# Patient Record
Sex: Male | Born: 1975 | Race: White | Hispanic: No | Marital: Married | Smoking: Never smoker
Health system: Southern US, Community
[De-identification: ages and names within clinical notes are randomized; demographics above are authoritative.]

## PROBLEM LIST (undated history)

## (undated) DIAGNOSIS — F419 Anxiety disorder, unspecified: Secondary | ICD-10-CM

## (undated) HISTORY — PX: NO PAST SURGERIES: SHX2092

---

## 2003-07-19 ENCOUNTER — Emergency Department (HOSPITAL_COMMUNITY): Admission: EM | Admit: 2003-07-19 | Discharge: 2003-07-19 | Payer: Self-pay | Admitting: Emergency Medicine

## 2013-02-17 ENCOUNTER — Emergency Department (HOSPITAL_COMMUNITY)
Admission: EM | Admit: 2013-02-17 | Discharge: 2013-02-17 | Disposition: A | Discharge status: Home / Self Care | Payer: Managed Care, Other (non HMO) | Attending: Emergency Medicine | Admitting: Emergency Medicine

## 2013-02-17 ENCOUNTER — Encounter (HOSPITAL_COMMUNITY): Payer: Self-pay | Admitting: *Deleted

## 2013-02-17 DIAGNOSIS — T148XXA Other injury of unspecified body region, initial encounter: Secondary | ICD-10-CM

## 2013-02-17 DIAGNOSIS — Z23 Encounter for immunization: Secondary | ICD-10-CM | POA: Insufficient documentation

## 2013-02-17 MED ORDER — RABIES VACCINE, PCEC IM SUSR
1.0000 mL | INTRAMUSCULAR | Status: AC
  Administered 2013-02-17: 1 mL via INTRAMUSCULAR
  Filled 2013-02-17: qty 1

## 2013-02-17 MED ORDER — RABIES IMMUNE GLOBULIN 150 UNIT/ML IM INJ
20.0000 [IU]/kg | INJECTION | INTRAMUSCULAR | Status: AC
  Administered 2013-02-17: 2100 [IU] via INTRAMUSCULAR
  Filled 2013-02-17: qty 14

## 2013-02-17 NOTE — ED Provider Notes (Signed)
CSN: [id number]     Arrival date & time 02/17/13  1815 History  This chart was scribed for non-physician practitioner Charlestine Night, PA-C, working with Flint Melter, MD by Dorothey Baseman, ED Scribe. This patient was seen in room TR10C/TR10C and the patient's care was started at [facility] PM.    Chief Complaint  Patient presents with  . Animal Bite   The history is provided by the patient. No language interpreter was used.   HPI Comments: Shane Mora is a 37 y.o. male who presents to the Emergency Department requesting a rabies vaccination. Patient was bitten by a dog on 02/07/2013 and was given a tetanus vaccination on 02/10/2013, but did not receive the rabies vaccination at that time. He states that he has notified animal control and saw that the dog was wearing a collar, but has not been able to locate the dog or its owner. Patient expresses that his concern for contracting rabies is low, but just wants to be sure. He denies fever, vomiting, diarrhea, or any other symptoms at this time.   History reviewed. No pertinent past medical history. History reviewed. No pertinent past surgical history. No family history on file. History  Substance Use Topics  . Smoking status: Never Smoker   . Smokeless tobacco: Not on file  . Alcohol Use: Yes     Comment: soc    Review of Systems  A complete 10 system review of systems was obtained and all systems are negative except as noted in the HPI and PMH.   Allergies  Flu virus vaccine  Home Medications  No current outpatient prescriptions on file.  Triage Vitals: BP 132/77  Pulse 58  Temp(Src) 97.9 F (36.6 C) (Oral)  Resp 16  Ht 5\' 11"  (1.803 m)  Wt 230 lb (104.327 kg)  BMI 32.09 kg/m2  SpO2 98%  Physical Exam  Nursing note and vitals reviewed. Constitutional: He is oriented to person, place, and time. He appears well-developed and well-nourished. No distress.  HENT:  Head: Normocephalic and atraumatic.  Eyes: Conjunctivae are  normal.  Neck: Normal range of motion. Neck supple.  Musculoskeletal: Normal range of motion.  Neurological: He is alert and oriented to person, place, and time.  Skin: Skin is warm and dry.  Psychiatric: He has a normal mood and affect. His behavior is normal.    ED Course  Procedures (including critical care time)  Medications  rabies immune globulin (HYPERAB) injection 2,100 Units (2,100 Units Intramuscular Given 02/17/13 2029)  rabies vaccine (RABAVERT) injection 1 mL (1 mL Intramuscular Given 02/17/13 2030)   DIAGNOSTIC STUDIES: Oxygen Saturation is 98% on room air, normal by my interpretation.    COORDINATION OF CARE: 6:44PM- Advised patient that rabies vaccinations are usually administered within a smaller time frame following an animal bite. Consulted with pharmacy before ordering the vaccination. Discussed treatment plan with patient at bedside and patient verbalized agreement.      MDM    Carlyle Dolly, PA-C 02/17/13 [phone]

## 2013-02-17 NOTE — ED Notes (Signed)
Pt monitored for 45 minutes per pharmacy recommendation. Denies any recent changes since vaccine administration.

## 2013-02-17 NOTE — ED Notes (Signed)
Pt was bite on Sept 5th by a dog he was helping and patient has notified animal control.  [place].  Last tetanus on Monday 02/10/13.  Pt was referred here to have rabies vaccination.

## 2013-02-17 NOTE — ED Notes (Addendum)
Pt referred to ED by urgent care to receive rabies vaccine. Pt sts he was bitten by a dog on his R wrist Sept 5. Pt denies N/V, dizziness, fever, or pain at site. NAD noted. A&Ox4.

## 2013-02-18 NOTE — ED Provider Notes (Signed)
Medical screening examination/treatment/procedure(s) were performed by non-physician practitioner and as supervising physician I was immediately available for consultation/collaboration.  Flint Melter, MD 02/18/13 [phone]

## 2013-02-21 ENCOUNTER — Emergency Department (HOSPITAL_COMMUNITY)
Admission: EM | Admit: 2013-02-21 | Discharge: 2013-02-21 | Disposition: A | Discharge status: Home / Self Care | Payer: Managed Care, Other (non HMO) | Source: Home / Self Care

## 2013-02-21 ENCOUNTER — Encounter (HOSPITAL_COMMUNITY): Payer: Self-pay | Admitting: Emergency Medicine

## 2013-02-21 DIAGNOSIS — Z203 Contact with and (suspected) exposure to rabies: Secondary | ICD-10-CM

## 2013-02-21 MED ORDER — RABIES VACCINE, PCEC IM SUSR
INTRAMUSCULAR | Status: AC
  Filled 2013-02-21: qty 1

## 2013-02-21 MED ORDER — RABIES VACCINE, PCEC IM SUSR
1.0000 mL | Freq: Once | INTRAMUSCULAR | Status: AC
  Administered 2013-02-21: 1 mL via INTRAMUSCULAR

## 2013-02-21 NOTE — ED Notes (Signed)
Here for first rabies injection. Voices no concerns at this time.

## 2013-02-24 ENCOUNTER — Emergency Department (HOSPITAL_COMMUNITY)
Admission: EM | Admit: 2013-02-24 | Discharge: 2013-02-24 | Disposition: A | Discharge status: Home / Self Care | Payer: Managed Care, Other (non HMO) | Source: Home / Self Care

## 2013-02-24 ENCOUNTER — Encounter (HOSPITAL_COMMUNITY): Payer: Self-pay

## 2013-02-24 DIAGNOSIS — Z203 Contact with and (suspected) exposure to rabies: Secondary | ICD-10-CM

## 2013-02-24 MED ORDER — RABIES VACCINE, PCEC IM SUSR
1.0000 mL | Freq: Once | INTRAMUSCULAR | Status: AC
  Administered 2013-02-24: 1 mL via INTRAMUSCULAR

## 2013-02-24 MED ORDER — RABIES VACCINE, PCEC IM SUSR
INTRAMUSCULAR | Status: AC
  Filled 2013-02-24: qty 1

## 2013-02-24 NOTE — ED Notes (Signed)
Here for day #7 of rabies series ; NAD at present

## 2013-03-03 ENCOUNTER — Encounter (HOSPITAL_COMMUNITY): Payer: Self-pay | Admitting: Emergency Medicine

## 2013-03-03 ENCOUNTER — Emergency Department (INDEPENDENT_AMBULATORY_CARE_PROVIDER_SITE_OTHER)
Admission: EM | Admit: 2013-03-03 | Discharge: 2013-03-03 | Disposition: A | Discharge status: Home / Self Care | Payer: Managed Care, Other (non HMO) | Source: Home / Self Care

## 2013-03-03 DIAGNOSIS — Z203 Contact with and (suspected) exposure to rabies: Secondary | ICD-10-CM

## 2013-03-03 MED ORDER — RABIES VACCINE, PCEC IM SUSR
1.0000 mL | Freq: Once | INTRAMUSCULAR | Status: AC
  Administered 2013-03-03: 1 mL via INTRAMUSCULAR

## 2013-03-03 MED ORDER — RABIES VACCINE, PCEC IM SUSR
INTRAMUSCULAR | Status: AC
  Filled 2013-03-03: qty 1

## 2013-03-03 NOTE — Discharge Instructions (Signed)
Follow up as needed

## 2013-03-03 NOTE — ED Notes (Signed)
Rabies injection 

## 2013-03-03 NOTE — ED Notes (Signed)
Patient feeling comfortable to leave

## 2013-03-03 NOTE — ED Notes (Signed)
Provided sprite

## 2013-03-03 NOTE — ED Notes (Signed)
Patient mentioned anxiety issues, requested to sit a bit prior to leaving

## 2014-07-10 ENCOUNTER — Other Ambulatory Visit: Payer: Self-pay | Admitting: Dermatology

## 2014-07-10 DIAGNOSIS — D485 Neoplasm of uncertain behavior of skin: Secondary | ICD-10-CM

## 2014-07-21 ENCOUNTER — Encounter (INDEPENDENT_AMBULATORY_CARE_PROVIDER_SITE_OTHER): Payer: Self-pay

## 2014-07-21 ENCOUNTER — Ambulatory Visit
Admission: RE | Admit: 2014-07-21 | Discharge: 2014-07-21 | Disposition: A | Discharge status: Home / Self Care | Payer: Managed Care, Other (non HMO) | Source: Ambulatory Visit | Attending: Dermatology | Admitting: Dermatology

## 2014-07-21 DIAGNOSIS — D485 Neoplasm of uncertain behavior of skin: Secondary | ICD-10-CM

## 2014-08-03 ENCOUNTER — Ambulatory Visit (INDEPENDENT_AMBULATORY_CARE_PROVIDER_SITE_OTHER): Payer: Managed Care, Other (non HMO) | Admitting: Psychology

## 2014-08-03 DIAGNOSIS — F411 Generalized anxiety disorder: Secondary | ICD-10-CM

## 2014-08-11 ENCOUNTER — Ambulatory Visit (INDEPENDENT_AMBULATORY_CARE_PROVIDER_SITE_OTHER): Payer: Managed Care, Other (non HMO) | Admitting: Psychology

## 2014-08-11 DIAGNOSIS — F411 Generalized anxiety disorder: Secondary | ICD-10-CM | POA: Diagnosis not present

## 2014-08-18 ENCOUNTER — Ambulatory Visit (INDEPENDENT_AMBULATORY_CARE_PROVIDER_SITE_OTHER): Payer: Managed Care, Other (non HMO) | Admitting: Psychology

## 2014-08-18 DIAGNOSIS — F411 Generalized anxiety disorder: Secondary | ICD-10-CM | POA: Diagnosis not present

## 2014-09-01 ENCOUNTER — Ambulatory Visit (INDEPENDENT_AMBULATORY_CARE_PROVIDER_SITE_OTHER): Payer: Managed Care, Other (non HMO) | Admitting: Psychology

## 2014-09-01 DIAGNOSIS — F411 Generalized anxiety disorder: Secondary | ICD-10-CM

## 2014-09-02 ENCOUNTER — Other Ambulatory Visit: Payer: Self-pay | Admitting: Family Medicine

## 2014-09-02 DIAGNOSIS — R0600 Dyspnea, unspecified: Secondary | ICD-10-CM

## 2014-09-03 ENCOUNTER — Ambulatory Visit (INDEPENDENT_AMBULATORY_CARE_PROVIDER_SITE_OTHER): Payer: Managed Care, Other (non HMO) | Admitting: Internal Medicine

## 2014-09-03 ENCOUNTER — Encounter (INDEPENDENT_AMBULATORY_CARE_PROVIDER_SITE_OTHER): Payer: Self-pay

## 2014-09-03 DIAGNOSIS — R0600 Dyspnea, unspecified: Secondary | ICD-10-CM | POA: Diagnosis not present

## 2014-09-03 LAB — PULMONARY FUNCTION TEST
DL/VA % PRED: 94 %
DL/VA: 4.52 ml/min/mmHg/L
DLCO unc % pred: 82 %
DLCO unc: 28.99 ml/min/mmHg
FEF 25-75 POST: 3.42 L/s
FEF 25-75 Pre: 2.87 L/sec
FEF2575-%CHANGE-POST: 19 %
FEF2575-%PRED-POST: 82 %
FEF2575-%PRED-PRE: 69 %
FEV1-%CHANGE-POST: 5 %
FEV1-%PRED-PRE: 77 %
FEV1-%Pred-Post: 81 %
FEV1-POST: 3.64 L
FEV1-Pre: 3.43 L
FEV1FVC-%CHANGE-POST: 0 %
FEV1FVC-%Pred-Pre: 96 %
FEV6-%Change-Post: 4 %
FEV6-%Pred-Post: 84 %
FEV6-%Pred-Pre: 80 %
FEV6-PRE: 4.43 L
FEV6-Post: 4.65 L
FEV6FVC-%Change-Post: 0 %
FEV6FVC-%PRED-PRE: 101 %
FEV6FVC-%Pred-Post: 101 %
FVC-%Change-Post: 5 %
FVC-%Pred-Post: 83 %
FVC-%Pred-Pre: 78 %
FVC-PRE: 4.44 L
FVC-Post: 4.67 L
POST FEV1/FVC RATIO: 78 %
Post FEV6/FVC ratio: 99 %
Pre FEV1/FVC ratio: 77 %
Pre FEV6/FVC Ratio: 100 %
RV % pred: 85 %
RV: 1.67 L
TLC % pred: 82 %
TLC: 6.03 L

## 2014-09-03 NOTE — Progress Notes (Signed)
PFT done today. 

## 2014-09-28 ENCOUNTER — Ambulatory Visit (INDEPENDENT_AMBULATORY_CARE_PROVIDER_SITE_OTHER): Payer: Managed Care, Other (non HMO) | Admitting: Internal Medicine

## 2014-09-28 ENCOUNTER — Encounter: Payer: Self-pay | Admitting: Internal Medicine

## 2014-09-28 ENCOUNTER — Ambulatory Visit (INDEPENDENT_AMBULATORY_CARE_PROVIDER_SITE_OTHER)
Admission: RE | Admit: 2014-09-28 | Discharge: 2014-09-28 | Disposition: A | Discharge status: Home / Self Care | Payer: Managed Care, Other (non HMO) | Source: Ambulatory Visit | Attending: Internal Medicine | Admitting: Internal Medicine

## 2014-09-28 VITALS — BP 112/72 | HR 67 | Ht 71.0 in | Wt 225.4 lb

## 2014-09-28 DIAGNOSIS — R0600 Dyspnea, unspecified: Secondary | ICD-10-CM

## 2014-09-28 MED ORDER — FAMOTIDINE 20 MG PO TABS: ORAL_TABLET | ORAL | Status: AC

## 2014-09-28 MED ORDER — PANTOPRAZOLE SODIUM 40 MG PO TBEC: 40.0000 mg | DELAYED_RELEASE_TABLET | Freq: Every day | ORAL | Status: DC

## 2014-09-28 MED ORDER — FAMOTIDINE 20 MG PO TABS: ORAL_TABLET | ORAL | Status: DC

## 2014-09-28 NOTE — Patient Instructions (Signed)
Pantoprazole (protonix) 40 mg   Take 30-60 min before first meal of the day and Pepcid 20 mg one bedtime until return to office - this is the best way to tell whether stomach acid is contributing to your problem.   GERD (REFLUX)  is an extremely common cause of respiratory symptoms just like yours , many times with no obvious heartburn at all.    It can be treated with medication, but also with lifestyle changes including avoidance of late meals, excessive alcohol, smoking cessation, and avoid fatty foods, chocolate, peppermint, colas, red wine, and acidic juices such as orange juice.  NO MINT OR MENTHOL PRODUCTS SO NO COUGH DROPS  USE SUGARLESS CANDY INSTEAD (Jolley ranchers or Stover's or Life Savers) or even ice chips will also do - the key is to swallow to prevent all throat clearing. NO OIL BASED VITAMINS - use powdered substitutes.   Call us  to schedule asthma challenge test in 2weeks - no sooner  Please remember to go to the  x-ray department downstairs for your tests - we will call you with the results when they are available.

## 2014-09-28 NOTE — Progress Notes (Signed)
Subjective:    Patient ID: Shane Mora, male    DOB: 1976/03/05    MRN: [id number]  HPI  66 yowm never smoker occ rhinitis with spring sneezing abuptly  in January 2016 began having attacks where could not catch his breath ? Work related typically lasting a few breaths to 30 min c/w panic attacks per psychology but referred to pulmonary  09/28/2014 to r/o asthma.   09/28/2014 1st [facility] office visit/ Shane Mora   Chief Complaint  Patient presents with  . Pulmonary Consult    Pt referred by Dr. Deniece Mora. Pt states he did a PFT x3 wks. Pt c/o SOB with excertion, occasional wheezing. prod cough thick yellow mucus x1 mth.   for the last 3 years not doing any aerobics but doing steps fine at work then and now. After 30 min of playing with kids p 25 lb gain more sob than usual Episodes occurrying every other day never noct  No obvious  Patterns in day to day or daytime variabilty or assoc  cp or chest tightness, subjective wheeze overt sinus or hb symptoms. No unusual exp hx or h/o childhood pna/ asthma or knowledge of premature birth.  Sleeping ok without nocturnal  or early am exacerbation  of respiratory  c/o's or need for noct saba. Also denies any obvious fluctuation of symptoms with weather or environmental changes or other aggravating or alleviating factors except as outlined above   Current Medications, Allergies, Complete Past Medical History, Past Surgical History, Family History, and Social History were reviewed in [employer] record.               Review of Systems  Constitutional: Negative for fever and unexpected weight change.  HENT: Positive for congestion. Negative for dental problem, ear discharge, mouth sores, nosebleeds, postnasal drip, rhinorrhea, sinus pressure, sneezing, sore throat and trouble swallowing.   Eyes: Negative for redness and itching.  Respiratory: Positive for cough, chest tightness, shortness of breath and wheezing.     Cardiovascular: Negative for palpitations and leg swelling.  Genitourinary: Negative for dysuria.  Neurological: Negative for headaches.  Hematological: Does not bruise/bleed easily.  Psychiatric/Behavioral: Negative for dysphoric mood.       Objective:   Physical Exam  amb wm nad   Wt Readings from Last 3 Encounters:  09/28/14 225 lb 6.4 oz (102.241 kg)  02/17/13 230 lb (104.327 kg)    Vital signs reviewed   HEENT: nl dentition, turbinates, and orophanx. Nl external ear canals without cough reflex   NECK :  without JVD/Nodes/TM/ nl carotid upstrokes bilaterally   LUNGS: no acc muscle use, clear to A and P bilaterally without cough on insp or exp maneuvers   CV:  RRR  no s3 or murmur or increase in P2, no edema   ABD:  soft and nontender with nl excursion in the supine position. No bruits or organomegaly, bowel sounds nl  MS:  warm without deformities, calf tenderness, cyanosis or clubbing  SKIN: warm and dry without lesions    NEURO:  alert, approp, no deficits    CXR PA and Lateral:   09/28/2014 :     I personally reviewed images and agree with radiology impression as follows:     1. Cardiomegaly. No overt congestive heart failure. 2. Low lung volumes with mild bibasilar atelectasis. My impression: poor insp lung volumes / effort related       Labs ordered/ reviewed   Lab 09/29/14 1658  NA 140  K 3.8  CL 106  CO2 29  BUN 17  CREATININE 0.88  GLUCOSE 102*    Recent Labs Lab 09/29/14 1658  HGB 14.3  HCT 43.3  WBC 6.2  PLT 212.0     Lab Results  Component Value Date   TSH 0.70 09/29/2014     Lab Results  Component Value Date   PROBNP 11.0 09/29/2014            Assessment & Plan:

## 2014-09-29 ENCOUNTER — Encounter: Payer: Self-pay | Admitting: Internal Medicine

## 2014-09-29 ENCOUNTER — Other Ambulatory Visit (INDEPENDENT_AMBULATORY_CARE_PROVIDER_SITE_OTHER): Payer: Managed Care, Other (non HMO)

## 2014-09-29 DIAGNOSIS — R0600 Dyspnea, unspecified: Secondary | ICD-10-CM | POA: Diagnosis not present

## 2014-09-29 LAB — BRAIN NATRIURETIC PEPTIDE: PRO B NATRI PEPTIDE: 11 pg/mL (ref 0.0–100.0)

## 2014-09-29 LAB — TSH: TSH: 0.7 u[IU]/mL (ref 0.35–4.50)

## 2014-09-29 LAB — BASIC METABOLIC PANEL
BUN: 17 mg/dL (ref 6–23)
CHLORIDE: 106 meq/L (ref 96–112)
CO2: 29 mEq/L (ref 19–32)
Calcium: 9.4 mg/dL (ref 8.4–10.5)
Creatinine, Ser: 0.88 mg/dL (ref 0.40–1.50)
GFR: 101.57 mL/min (ref >60.00)
Glucose, Bld: 102 mg/dL — ABNORMAL HIGH (ref 70–99)
Potassium: 3.8 mEq/L (ref 3.5–5.1)
SODIUM: 140 meq/L (ref 135–145)

## 2014-09-29 LAB — CBC WITH DIFFERENTIAL/PLATELET
BASOS ABS: 0 10*3/uL (ref 0.0–0.1)
Basophils Relative: 0.4 % (ref 0.0–3.0)
EOS PCT: 4.8 % (ref 0.0–5.0)
Eosinophils Absolute: 0.3 10*3/uL (ref 0.0–0.7)
HCT: 43.3 % (ref 39.0–52.0)
HEMOGLOBIN: 14.3 g/dL (ref 13.0–17.0)
LYMPHS PCT: 36.2 % (ref 12.0–46.0)
Lymphs Abs: 2.2 10*3/uL (ref 0.7–4.0)
MCHC: 33.1 g/dL (ref 30.0–36.0)
MCV: 80.6 fl (ref 78.0–100.0)
MONO ABS: 0.5 10*3/uL (ref 0.1–1.0)
MONOS PCT: 7.4 % (ref 3.0–12.0)
Neutro Abs: 3.2 10*3/uL (ref 1.4–7.7)
Neutrophils Relative %: 51.2 % (ref 43.0–77.0)
PLATELETS: 212 10*3/uL (ref 150.0–400.0)
RBC: 5.38 Mil/uL (ref 4.22–5.81)
RDW: 13.9 % (ref 11.5–15.5)
WBC: 6.2 10*3/uL (ref 4.0–10.5)

## 2014-09-29 NOTE — Assessment & Plan Note (Addendum)
-   PFT 09/03/14 wnl with fef 25-75 69%   His hx of attacks that are predominantly daytime and spontaneously  resolve w/in a few min is most c/w panic disorder, not asthma  Although the finding of a reduced mid flow can be seen in asthma, typically its < 60 % in an active asthmatic and the best way to be sure is to do a methacholine while on GERD rx, which needs to be addressed first because GERD/ LPR can cause a pattern of sob indistinguishable from panic/ VCD and cause a false pos MCT.  See instructions for specific recommendations which were reviewed directly with the patient who was given a copy with highlighter outlining the key components.

## 2014-09-29 NOTE — Progress Notes (Signed)
Quick Note:  Spoke with pt and notified of results per Dr. Wert. Pt verbalized understanding and denied any questions.  ______ 

## 2014-11-16 DIAGNOSIS — M79673 Pain in unspecified foot: Secondary | ICD-10-CM

## 2017-08-01 ENCOUNTER — Other Ambulatory Visit: Payer: Self-pay | Admitting: Dermatology

## 2017-08-01 DIAGNOSIS — D170 Benign lipomatous neoplasm of skin and subcutaneous tissue of head, face and neck: Secondary | ICD-10-CM

## 2017-08-13 ENCOUNTER — Other Ambulatory Visit: Payer: Self-pay

## 2017-10-09 ENCOUNTER — Ambulatory Visit
Admission: RE | Admit: 2017-10-09 | Discharge: 2017-10-09 | Disposition: A | Discharge status: Home / Self Care | Payer: Managed Care, Other (non HMO) | Source: Ambulatory Visit | Attending: Dermatology | Admitting: Dermatology

## 2017-10-09 ENCOUNTER — Other Ambulatory Visit: Payer: Self-pay | Admitting: Dermatology

## 2017-10-09 DIAGNOSIS — D170 Benign lipomatous neoplasm of skin and subcutaneous tissue of head, face and neck: Secondary | ICD-10-CM

## 2017-12-05 ENCOUNTER — Ambulatory Visit
Admission: RE | Admit: 2017-12-05 | Discharge: 2017-12-05 | Disposition: A | Discharge status: Home / Self Care | Payer: Managed Care, Other (non HMO) | Source: Ambulatory Visit | Attending: Family Medicine | Admitting: Family Medicine

## 2017-12-05 ENCOUNTER — Other Ambulatory Visit: Payer: Self-pay | Admitting: Family Medicine

## 2017-12-05 ENCOUNTER — Other Ambulatory Visit: Payer: Self-pay

## 2017-12-05 DIAGNOSIS — M7989 Other specified soft tissue disorders: Secondary | ICD-10-CM

## 2017-12-05 DIAGNOSIS — M79641 Pain in right hand: Secondary | ICD-10-CM

## 2017-12-13 DIAGNOSIS — S62326A Displaced fracture of shaft of fifth metacarpal bone, right hand, initial encounter for closed fracture: Secondary | ICD-10-CM | POA: Insufficient documentation

## 2018-04-26 ENCOUNTER — Telehealth: Payer: Self-pay | Admitting: Podiatry

## 2018-04-26 ENCOUNTER — Other Ambulatory Visit: Payer: Self-pay | Admitting: Podiatry

## 2018-04-26 ENCOUNTER — Ambulatory Visit (INDEPENDENT_AMBULATORY_CARE_PROVIDER_SITE_OTHER): Payer: Managed Care, Other (non HMO)

## 2018-04-26 ENCOUNTER — Ambulatory Visit (INDEPENDENT_AMBULATORY_CARE_PROVIDER_SITE_OTHER): Payer: Managed Care, Other (non HMO) | Admitting: Podiatry

## 2018-04-26 ENCOUNTER — Encounter: Payer: Self-pay | Admitting: Podiatry

## 2018-04-26 VITALS — BP 110/72 | HR 85 | Resp 16

## 2018-04-26 DIAGNOSIS — M779 Enthesopathy, unspecified: Secondary | ICD-10-CM | POA: Diagnosis not present

## 2018-04-26 DIAGNOSIS — M79671 Pain in right foot: Secondary | ICD-10-CM

## 2018-04-26 DIAGNOSIS — L309 Dermatitis, unspecified: Secondary | ICD-10-CM | POA: Diagnosis not present

## 2018-04-26 DIAGNOSIS — L600 Ingrowing nail: Secondary | ICD-10-CM

## 2018-04-26 DIAGNOSIS — M79672 Pain in left foot: Secondary | ICD-10-CM

## 2018-04-26 MED ORDER — NEOMYCIN-POLYMYXIN-HC 3.5-10000-1 OT SOLN: OTIC | 1 refills | Status: DC

## 2018-04-26 NOTE — Progress Notes (Signed)
   Subjective:    Patient ID: Shane Mora, male    DOB: 05/08/1976, 42 y.o.   MRN: [id number]  HPI    Review of Systems  All other systems reviewed and are negative.      Objective:   Physical Exam        Assessment & Plan:

## 2018-04-26 NOTE — Telephone Encounter (Signed)
lvm for pt to call to schedule an appt to see [facility].

## 2018-04-26 NOTE — Progress Notes (Signed)
Subjective:   Patient ID: Shane Mora, male   DOB: 42 y.o.   MRN: [id number]   HPI Patient presents stating he has trouble with his right big toenail and has had a long-term history of orthotics and has not had new ones for years and states they are no longer as effective as they were.  Patient had chronic plantar feet issues and also has what he thinks is athlete's foot.  Patient does not smoke and likes to be active   Review of Systems  All other systems reviewed and are negative.       Objective:  Physical Exam  Constitutional: He appears well-developed and well-nourished.  Cardiovascular: Intact distal pulses.  Pulmonary/Chest: Effort normal.  Musculoskeletal: Normal range of motion.  Neurological: He is alert.  Skin: Skin is warm.  Nursing note and vitals reviewed.   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  He is also found to have sweating feet and has mild peeling of the bottom of both feet with no other nail pathology noted.  The right hallux medial border is painful when pressed and at times shoe gear can be difficult and tries to trim it himself.  Patient has mild flatfoot deformity bilateral with orthotics which are not effective in holding his arch     Assessment:  Ingrown toenail deformity right hallux with moderate flatfoot deformity bilateral with orthotics which have lost her ability to hold his arch of and probable low-grade dermatitis secondary to excessive sweating of his feet     Plan:  H&P x-rays reviewed all conditions discussed.  At this point I would focus on correcting his ingrown toenail and I want him to see the ped orthotist for orthotic casting and patient is getting go this route.  I went ahead today and I explained  correction of the ingrown allowed him to read consent form and then infiltrated the right hallux 60 mg like Marcaine mixture sterile prep applied to the toe removed the medial border exposed matrix and applied phenol 3 applications 30 seconds followed by alcohol lavage and sterile dressing.  Gave instructions on soaks and leave the dressing on 24 hours but to take it off early if it should become throbbing and also wrote prescription for Corticosporin otic solution.  I am going to have him see the ped orthotist for orthotic casting and to have new orthotics made and also have his older pair refurbished.  Patient to be seen by ped orthotist for this service  X-rays indicate moderate depression of the arch with no indications of arthritis or other pathology

## 2018-05-06 ENCOUNTER — Telehealth: Payer: Self-pay | Admitting: *Deleted

## 2018-05-06 MED ORDER — NEOMYCIN-POLYMYXIN-HC 3.5-10000-1 OT SOLN: OTIC | 1 refills | Status: DC

## 2018-05-06 NOTE — Telephone Encounter (Signed)
Pt states his prescription should be sent to the [facility] on [address] and [address].

## 2018-05-06 NOTE — Telephone Encounter (Signed)
left message informing pt the rx had been sent to the [facility].

## 2018-05-09 ENCOUNTER — Ambulatory Visit (INDEPENDENT_AMBULATORY_CARE_PROVIDER_SITE_OTHER): Payer: Managed Care, Other (non HMO) | Admitting: Podiatry

## 2018-05-09 ENCOUNTER — Encounter: Payer: Self-pay | Admitting: Podiatry

## 2018-05-09 DIAGNOSIS — L600 Ingrowing nail: Secondary | ICD-10-CM

## 2018-05-09 DIAGNOSIS — M779 Enthesopathy, unspecified: Secondary | ICD-10-CM

## 2018-05-12 NOTE — Progress Notes (Signed)
Subjective:   Patient ID: Shane Mora, male   DOB: 42 y.o.   MRN: [id number]   HPI Patient states doing well minimal drainage and just wanted to get the nail checked   ROS      Objective:  Physical Exam  Neurovascular status intact well-healed nail site crusted over with no proximal edema erythema drainage     Assessment:  Nail disease fixed well with ingrown with excellent healing     Plan:  Continue soaking to all drainage has ceased and will be seen back as needed

## 2018-05-13 ENCOUNTER — Ambulatory Visit: Payer: Managed Care, Other (non HMO) | Admitting: Orthotics

## 2018-05-13 DIAGNOSIS — M79671 Pain in right foot: Secondary | ICD-10-CM

## 2018-05-13 DIAGNOSIS — M7752 Other enthesopathy of left foot: Secondary | ICD-10-CM

## 2018-05-13 DIAGNOSIS — M7751 Other enthesopathy of right foot: Secondary | ICD-10-CM

## 2018-05-13 DIAGNOSIS — M779 Enthesopathy, unspecified: Secondary | ICD-10-CM

## 2018-05-13 DIAGNOSIS — M79672 Pain in left foot: Secondary | ICD-10-CM

## 2018-05-13 NOTE — Progress Notes (Signed)

## 2018-05-24 ENCOUNTER — Encounter: Payer: Self-pay | Admitting: Plastic Surgery

## 2018-05-24 ENCOUNTER — Ambulatory Visit: Payer: Managed Care, Other (non HMO) | Admitting: Plastic Surgery

## 2018-05-24 DIAGNOSIS — D171 Benign lipomatous neoplasm of skin and subcutaneous tissue of trunk: Secondary | ICD-10-CM | POA: Insufficient documentation

## 2018-05-24 DIAGNOSIS — R221 Localized swelling, mass and lump, neck: Secondary | ICD-10-CM

## 2018-05-24 NOTE — Progress Notes (Signed)
     Patient ID: Shane Mora, male    DOB: 09-Apr-1976, 42 y.o.   MRN: [id number]   Chief Complaint  Patient presents with  . Skin Problem    The patient is a 42 year old [place] male here for evaluation of a mass on his right posterior neck.  He states it has been there for several years but is getting much larger.  It is soft but not very mobile.  I do not see any sign of infection, redness or skin breakdown.  He does not have any neurologic symptoms, no numbness, no tingling or loss of strength.  He is otherwise in good health.  The mass is 6 x 7 cm.  Nothing makes it worse.  He has not had anything like this in the past.   Review of Systems  Constitutional: Negative.  Negative for activity change.  HENT: Negative.   Eyes: Negative.   Respiratory: Negative.   Cardiovascular: Negative.   Gastrointestinal: Negative.   Endocrine: Negative.   Genitourinary: Negative.   Musculoskeletal: Negative.   Skin: Negative.  Negative for color change, rash and wound.  Psychiatric/Behavioral: Negative.     History reviewed. No pertinent past medical history.  History reviewed. No pertinent surgical history.    Current Outpatient Medications:  .  Ascorbic Acid (VITAMIN C) 100 MG tablet, Take 100 mg by mouth daily., Disp: , Rfl:  .  Cholecalciferol (VITAMIN D-1000 MAX ST) 25 MCG (1000 UT) tablet, Take by mouth., Disp: , Rfl:  .  famotidine (PEPCID) 20 MG tablet, One at bedtime, Disp: 30 tablet, Rfl: 2 .  VITAMIN K, PHYTONADIONE, PO, Take by mouth., Disp: , Rfl:    Objective:   There were no vitals filed for this visit.  Physical Exam Vitals signs and nursing note reviewed.  Constitutional:      Appearance: Normal appearance.  HENT:     Head: Normocephalic and atraumatic.     Mouth/Throat:     Mouth: Mucous membranes are moist.  Neck:     Musculoskeletal: Normal range of motion.   Cardiovascular:     Rate and Rhythm: Normal rate.     Pulses: Normal pulses.  Abdominal:     General:  Abdomen is flat. There is no distension.     Palpations: There is no mass.  Skin:    General: Skin is warm.  Neurological:     General: No focal deficit present.     Mental Status: He is alert and oriented to person, place, and time.     Cranial Nerves: No cranial nerve deficit.     Motor: No weakness.     Gait: Gait normal.  Psychiatric:        Mood and Affect: Mood normal.        Thought Content: Thought content normal.        Judgment: Judgment normal.     Assessment & Plan:  Mass of neck Recommend excision of the posterior right neck mass.  I think it would be best to do this in the operating room due to the location.  We will likely send this to pathology for evaluation.  [place], DO

## 2018-06-13 ENCOUNTER — Ambulatory Visit: Payer: Self-pay | Admitting: Orthotics

## 2018-06-13 DIAGNOSIS — M79676 Pain in unspecified toe(s): Secondary | ICD-10-CM

## 2018-06-13 DIAGNOSIS — M779 Enthesopathy, unspecified: Secondary | ICD-10-CM

## 2018-06-13 NOTE — Progress Notes (Signed)
Patient came in today to pick up custom made foot orthotics.  The goals were accomplished and the patient reported no dissatisfaction with said orthotics.  Patient was advised of breakin period and how to report any issues.Patient came in today to pick up custom made foot orthotics.  The goals were accomplished and the patient reported no dissatisfaction with said orthotics.  Patient was advised of breakin period and how to report any issues. 

## 2018-07-10 ENCOUNTER — Encounter (HOSPITAL_BASED_OUTPATIENT_CLINIC_OR_DEPARTMENT_OTHER): Payer: Self-pay | Admitting: *Deleted

## 2018-07-10 ENCOUNTER — Other Ambulatory Visit: Payer: Self-pay

## 2018-07-18 ENCOUNTER — Ambulatory Visit (HOSPITAL_BASED_OUTPATIENT_CLINIC_OR_DEPARTMENT_OTHER): Payer: Managed Care, Other (non HMO) | Admitting: Anesthesiology

## 2018-07-18 ENCOUNTER — Encounter (HOSPITAL_BASED_OUTPATIENT_CLINIC_OR_DEPARTMENT_OTHER)
Admission: RE | Disposition: A | Discharge status: Home / Self Care | Payer: Self-pay | Source: Home / Self Care | Attending: Plastic Surgery

## 2018-07-18 ENCOUNTER — Ambulatory Visit (HOSPITAL_BASED_OUTPATIENT_CLINIC_OR_DEPARTMENT_OTHER)
Admission: RE | Admit: 2018-07-18 | Discharge: 2018-07-18 | Disposition: A | Discharge status: Home / Self Care | Payer: Managed Care, Other (non HMO) | Attending: Plastic Surgery | Admitting: Plastic Surgery

## 2018-07-18 ENCOUNTER — Encounter (HOSPITAL_BASED_OUTPATIENT_CLINIC_OR_DEPARTMENT_OTHER): Payer: Self-pay

## 2018-07-18 ENCOUNTER — Other Ambulatory Visit: Payer: Self-pay

## 2018-07-18 DIAGNOSIS — Z6834 Body mass index (BMI) 34.0-34.9, adult: Secondary | ICD-10-CM | POA: Diagnosis not present

## 2018-07-18 DIAGNOSIS — D170 Benign lipomatous neoplasm of skin and subcutaneous tissue of head, face and neck: Secondary | ICD-10-CM | POA: Diagnosis not present

## 2018-07-18 DIAGNOSIS — R221 Localized swelling, mass and lump, neck: Secondary | ICD-10-CM | POA: Diagnosis present

## 2018-07-18 DIAGNOSIS — Z79899 Other long term (current) drug therapy: Secondary | ICD-10-CM | POA: Insufficient documentation

## 2018-07-18 HISTORY — PX: MASS EXCISION: SHX2000

## 2018-07-18 HISTORY — DX: Anxiety disorder, unspecified: F41.9

## 2018-07-18 SURGERY — EXCISION MASS
Anesthesia: General | Site: Neck

## 2018-07-18 MED ORDER — FENTANYL CITRATE (PF) 100 MCG/2ML IJ SOLN
INTRAMUSCULAR | Status: AC
  Filled 2018-07-18: qty 2

## 2018-07-18 MED ORDER — SCOPOLAMINE 1 MG/3DAYS TD PT72: 1.0000 | MEDICATED_PATCH | Freq: Once | TRANSDERMAL | Status: DC | PRN

## 2018-07-18 MED ORDER — FENTANYL CITRATE (PF) 100 MCG/2ML IJ SOLN: 50.0000 ug | INTRAMUSCULAR | Status: DC | PRN

## 2018-07-18 MED ORDER — ACETAMINOPHEN 650 MG RE SUPP: 650.0000 mg | RECTAL | Status: DC | PRN

## 2018-07-18 MED ORDER — SODIUM CHLORIDE 0.9 % IV SOLN: 250.0000 mL | INTRAVENOUS | Status: DC | PRN

## 2018-07-18 MED ORDER — DEXAMETHASONE SODIUM PHOSPHATE 10 MG/ML IJ SOLN
INTRAMUSCULAR | Status: DC | PRN
  Administered 2018-07-18: 4 mg via INTRAVENOUS

## 2018-07-18 MED ORDER — DEXAMETHASONE SODIUM PHOSPHATE 10 MG/ML IJ SOLN
INTRAMUSCULAR | Status: AC
  Filled 2018-07-18: qty 1

## 2018-07-18 MED ORDER — LIDOCAINE HCL (CARDIAC) PF 100 MG/5ML IV SOSY
PREFILLED_SYRINGE | INTRAVENOUS | Status: DC | PRN
  Administered 2018-07-18: 100 mg via INTRAVENOUS

## 2018-07-18 MED ORDER — OXYCODONE HCL 5 MG PO TABS: 5.0000 mg | ORAL_TABLET | ORAL | Status: DC | PRN

## 2018-07-18 MED ORDER — ONDANSETRON HCL 4 MG/2ML IJ SOLN
INTRAMUSCULAR | Status: AC
  Filled 2018-07-18: qty 2

## 2018-07-18 MED ORDER — LACTATED RINGERS IV SOLN
INTRAVENOUS | Status: DC
  Administered 2018-07-18: 10:00:00 via INTRAVENOUS

## 2018-07-18 MED ORDER — MIDAZOLAM HCL 2 MG/2ML IJ SOLN
INTRAMUSCULAR | Status: DC | PRN
  Administered 2018-07-18: 2 mg via INTRAVENOUS

## 2018-07-18 MED ORDER — ONDANSETRON HCL 4 MG/2ML IJ SOLN: 4.0000 mg | Freq: Once | INTRAMUSCULAR | Status: DC | PRN

## 2018-07-18 MED ORDER — SODIUM CHLORIDE 0.9% FLUSH: 3.0000 mL | INTRAVENOUS | Status: DC | PRN

## 2018-07-18 MED ORDER — FENTANYL CITRATE (PF) 100 MCG/2ML IJ SOLN: 25.0000 ug | INTRAMUSCULAR | Status: DC | PRN

## 2018-07-18 MED ORDER — LIDOCAINE-EPINEPHRINE 1 %-1:100000 IJ SOLN
INTRAMUSCULAR | Status: DC | PRN
  Administered 2018-07-18: 4 mL

## 2018-07-18 MED ORDER — CEFAZOLIN SODIUM-DEXTROSE 2-4 GM/100ML-% IV SOLN
INTRAVENOUS | Status: AC
  Filled 2018-07-18: qty 100

## 2018-07-18 MED ORDER — PROPOFOL 10 MG/ML IV BOLUS
INTRAVENOUS | Status: DC | PRN
  Administered 2018-07-18: 200 mg via INTRAVENOUS

## 2018-07-18 MED ORDER — ACETAMINOPHEN 325 MG PO TABS: 650.0000 mg | ORAL_TABLET | ORAL | Status: DC | PRN

## 2018-07-18 MED ORDER — MIDAZOLAM HCL 2 MG/2ML IJ SOLN
INTRAMUSCULAR | Status: AC
  Filled 2018-07-18: qty 2

## 2018-07-18 MED ORDER — FENTANYL CITRATE (PF) 100 MCG/2ML IJ SOLN
INTRAMUSCULAR | Status: DC | PRN
  Administered 2018-07-18: 100 ug via INTRAVENOUS
  Administered 2018-07-18 (×2): 50 ug via INTRAVENOUS

## 2018-07-18 MED ORDER — SUCCINYLCHOLINE CHLORIDE 200 MG/10ML IV SOSY
PREFILLED_SYRINGE | INTRAVENOUS | Status: AC
  Filled 2018-07-18: qty 10

## 2018-07-18 MED ORDER — SUCCINYLCHOLINE CHLORIDE 200 MG/10ML IV SOSY
PREFILLED_SYRINGE | INTRAVENOUS | Status: DC | PRN
  Administered 2018-07-18: 120 mg via INTRAVENOUS

## 2018-07-18 MED ORDER — SODIUM CHLORIDE 0.9% FLUSH: 3.0000 mL | Freq: Two times a day (BID) | INTRAVENOUS | Status: DC

## 2018-07-18 MED ORDER — ONDANSETRON HCL 4 MG/2ML IJ SOLN
INTRAMUSCULAR | Status: DC | PRN
  Administered 2018-07-18: 4 mg via INTRAVENOUS

## 2018-07-18 MED ORDER — LIDOCAINE-EPINEPHRINE 1 %-1:100000 IJ SOLN
INTRAMUSCULAR | Status: AC
  Filled 2018-07-18: qty 2

## 2018-07-18 MED ORDER — LIDOCAINE 2% (20 MG/ML) 5 ML SYRINGE
INTRAMUSCULAR | Status: AC
  Filled 2018-07-18: qty 5

## 2018-07-18 MED ORDER — PROPOFOL 10 MG/ML IV BOLUS
INTRAVENOUS | Status: AC
  Filled 2018-07-18: qty 20

## 2018-07-18 MED ORDER — MIDAZOLAM HCL 2 MG/2ML IJ SOLN: 1.0000 mg | INTRAMUSCULAR | Status: DC | PRN

## 2018-07-18 MED ORDER — CEFAZOLIN SODIUM-DEXTROSE 2-4 GM/100ML-% IV SOLN
2.0000 g | INTRAVENOUS | Status: AC
  Administered 2018-07-18: 2 g via INTRAVENOUS

## 2018-07-18 SURGICAL SUPPLY — 66 items
BENZOIN TINCTURE PRP APPL 2/3 (GAUZE/BANDAGES/DRESSINGS) IMPLANT
BLADE CLIPPER SURG (BLADE) IMPLANT
BLADE SURG 15 STRL LF DISP TIS (BLADE) ×1 IMPLANT
BLADE SURG 15 STRL SS (BLADE) ×2
BNDG CONFORM 2 STRL LF (GAUZE/BANDAGES/DRESSINGS) IMPLANT
BNDG ELASTIC 2X5.8 VLCR STR LF (GAUZE/BANDAGES/DRESSINGS) IMPLANT
CANISTER SUCT 1200ML W/VALVE (MISCELLANEOUS) IMPLANT
CHLORAPREP W/TINT 26ML (MISCELLANEOUS) ×3 IMPLANT
CLEANER CAUTERY TIP 5X5 PAD (MISCELLANEOUS) IMPLANT
CLOSURE WOUND 1/2 X4 (GAUZE/BANDAGES/DRESSINGS)
CORD BIPOLAR FORCEPS 12FT (ELECTRODE) IMPLANT
COVER BACK TABLE 60X90IN (DRAPES) ×3 IMPLANT
COVER MAYO STAND STRL (DRAPES) ×3 IMPLANT
COVER WAND RF STERILE (DRAPES) IMPLANT
DECANTER SPIKE VIAL GLASS SM (MISCELLANEOUS) IMPLANT
DERMABOND ADVANCED (GAUZE/BANDAGES/DRESSINGS) ×2
DERMABOND ADVANCED .7 DNX12 (GAUZE/BANDAGES/DRESSINGS) ×1 IMPLANT
DRAPE LAPAROTOMY 100X72 PEDS (DRAPES) IMPLANT
DRAPE U-SHAPE 76X120 STRL (DRAPES) ×3 IMPLANT
DRSG TEGADERM 2-3/8X2-3/4 SM (GAUZE/BANDAGES/DRESSINGS) IMPLANT
DRSG TEGADERM 4X4.75 (GAUZE/BANDAGES/DRESSINGS) IMPLANT
ELECT COATED BLADE 2.86 ST (ELECTRODE) IMPLANT
ELECT NEEDLE BLADE 2-5/6 (NEEDLE) ×3 IMPLANT
ELECT REM PT RETURN 9FT ADLT (ELECTROSURGICAL) ×3
ELECT REM PT RETURN 9FT PED (ELECTROSURGICAL)
ELECTRODE REM PT RETRN 9FT PED (ELECTROSURGICAL) IMPLANT
ELECTRODE REM PT RTRN 9FT ADLT (ELECTROSURGICAL) ×1 IMPLANT
GAUZE SPONGE 4X4 12PLY STRL LF (GAUZE/BANDAGES/DRESSINGS) IMPLANT
GLOVE BIO SURGEON STRL SZ 6.5 (GLOVE) ×6 IMPLANT
GLOVE BIO SURGEON STRL SZ7.5 (GLOVE) ×3 IMPLANT
GLOVE BIO SURGEONS STRL SZ 6.5 (GLOVE) ×3
GOWN STRL REUS W/ TWL LRG LVL3 (GOWN DISPOSABLE) ×3 IMPLANT
GOWN STRL REUS W/TWL LRG LVL3 (GOWN DISPOSABLE) ×6
NEEDLE HYPO 30GX1 BEV (NEEDLE) IMPLANT
NEEDLE PRECISIONGLIDE 27X1.5 (NEEDLE) ×3 IMPLANT
NS IRRIG 1000ML POUR BTL (IV SOLUTION) ×3 IMPLANT
PACK BASIN DAY SURGERY FS (CUSTOM PROCEDURE TRAY) ×3 IMPLANT
PAD CLEANER CAUTERY TIP 5X5 (MISCELLANEOUS)
PENCIL BUTTON HOLSTER BLD 10FT (ELECTRODE) ×3 IMPLANT
RUBBERBAND STERILE (MISCELLANEOUS) IMPLANT
SHEET MEDIUM DRAPE 40X70 STRL (DRAPES) IMPLANT
SLEEVE SCD COMPRESS KNEE MED (MISCELLANEOUS) ×3 IMPLANT
SPONGE GAUZE 2X2 8PLY STER LF (GAUZE/BANDAGES/DRESSINGS)
SPONGE GAUZE 2X2 8PLY STRL LF (GAUZE/BANDAGES/DRESSINGS) IMPLANT
STRIP CLOSURE SKIN 1/2X4 (GAUZE/BANDAGES/DRESSINGS) IMPLANT
SUCTION FRAZIER HANDLE 10FR (MISCELLANEOUS) ×2
SUCTION TUBE FRAZIER 10FR DISP (MISCELLANEOUS) ×1 IMPLANT
SUT MNCRL 6-0 UNDY P1 1X18 (SUTURE) IMPLANT
SUT MNCRL AB 3-0 PS2 18 (SUTURE) IMPLANT
SUT MNCRL AB 4-0 PS2 18 (SUTURE) ×3 IMPLANT
SUT MON AB 5-0 P3 18 (SUTURE) IMPLANT
SUT MON AB 5-0 PS2 18 (SUTURE) ×3 IMPLANT
SUT MONOCRYL 6-0 P1 1X18 (SUTURE)
SUT PROLENE 5 0 P 3 (SUTURE) IMPLANT
SUT PROLENE 5 0 PS 2 (SUTURE) IMPLANT
SUT PROLENE 6 0 P 1 18 (SUTURE) IMPLANT
SUT VIC AB 5-0 P-3 18X BRD (SUTURE) IMPLANT
SUT VIC AB 5-0 P3 18 (SUTURE)
SUT VIC AB 5-0 PS2 18 (SUTURE) IMPLANT
SUT VICRYL 4-0 PS2 18IN ABS (SUTURE) IMPLANT
SYR BULB 3OZ (MISCELLANEOUS) ×3 IMPLANT
SYR CONTROL 10ML LL (SYRINGE) ×3 IMPLANT
TOWEL GREEN STERILE FF (TOWEL DISPOSABLE) ×3 IMPLANT
TRAY DSU PREP LF (CUSTOM PROCEDURE TRAY) IMPLANT
TUBE CONNECTING 20'X1/4 (TUBING) ×1
TUBE CONNECTING 20X1/4 (TUBING) ×2 IMPLANT

## 2018-07-18 NOTE — Anesthesia Preprocedure Evaluation (Addendum)
Anesthesia Evaluation  Patient identified by MRN, date of birth, ID band Patient awake    Reviewed: Allergy & Precautions, NPO status , Patient's Chart, lab work & pertinent test results  Airway Mallampati: II  TM Distance: >3 FB Neck ROM: Full    Dental  (+) Dental Advisory Given   Pulmonary neg pulmonary ROS,    breath sounds clear to auscultation       Cardiovascular negative cardio ROS   Rhythm:Regular Rate:Normal     Neuro/Psych negative neurological ROS     GI/Hepatic negative GI ROS, Neg liver ROS,   Endo/Other  Morbid obesity  Renal/GU negative Renal ROS     Musculoskeletal   Abdominal   Peds  Hematology negative hematology ROS (+)   Anesthesia Other Findings   Reproductive/Obstetrics                            Lab Results  Component Value Date   WBC 6.2 09/29/2014   HGB 14.3 09/29/2014   HCT 43.3 09/29/2014   MCV 80.6 09/29/2014   PLT 212.0 09/29/2014   Lab Results  Component Value Date   CREATININE 0.88 09/29/2014   BUN 17 09/29/2014   NA 140 09/29/2014   K 3.8 09/29/2014   CL 106 09/29/2014   CO2 29 09/29/2014    Anesthesia Physical Anesthesia Plan  ASA: II  Anesthesia Plan: General   Post-op Pain Management:    Induction: Intravenous  PONV Risk Score and Plan: 2 and Dexamethasone, Ondansetron and Treatment may vary due to age or medical condition  Airway Management Planned: Oral ETT  Additional Equipment:   Intra-op Plan:   Post-operative Plan: Extubation in OR  Informed Consent: I have reviewed the patients History and Physical, chart, labs and discussed the procedure including the risks, benefits and alternatives for the proposed anesthesia with the patient or authorized representative who has indicated his/her understanding and acceptance.     Dental advisory given  Plan Discussed with: CRNA  Anesthesia Plan Comments:          Anesthesia Quick Evaluation

## 2018-07-18 NOTE — Anesthesia Postprocedure Evaluation (Signed)
Anesthesia Post Note  Patient: Elena Southers  Procedure(s) Performed: EXCISION POSTERIOR NECK MASS (N/A Neck)     Patient location during evaluation: PACU Anesthesia Type: General Level of consciousness: awake and alert Pain management: pain level controlled Vital Signs Assessment: post-procedure vital signs reviewed and stable Respiratory status: spontaneous breathing, nonlabored ventilation and respiratory function stable Cardiovascular status: blood pressure returned to baseline and stable Postop Assessment: no apparent nausea or vomiting Anesthetic complications: no    Last Vitals:  Vitals:   07/18/18 1145 07/18/18 1152  BP: 111/82 111/82  Pulse: 70 (!) 56  Resp: 18 16  Temp:  36.5 C  SpO2: 96% 98%    Last Pain:  Vitals:   07/18/18 1152  TempSrc:   PainSc: 1                  Lynda Rainwater

## 2018-07-18 NOTE — H&P (Signed)
Shane Mora is an 43 y.o. male.   Chief Complaint: mass of neck HPI: The patient is a 42 yrs old male here for treatment of his right posterior neck mass.  It has been present for several years and getting larger.  There is no sign of infection and nothing that makes it better. It is 6 x 7 cm in size.   Past Medical History:  Diagnosis Date  . Anxiety     Past Surgical History:  Procedure Laterality Date  . NO PAST SURGERIES      Family History  Problem Relation Age of Onset  . Asthma Mother   . Heart disease Mother   . High Cholesterol Brother    Social History:  reports that he has never smoked. He has never used smokeless tobacco. He reports current alcohol use. He reports that he does not use drugs.  Allergies:  Allergies  Allergen Reactions  . Flu Virus Vaccine Shortness Of Breath  . Haemophilus Influenzae Shortness Of Breath    Medications Prior to Admission  Medication Sig Dispense Refill  . Ascorbic Acid (VITAMIN C) 100 MG tablet Take 100 mg by mouth daily.    . Cholecalciferol (VITAMIN D-1000 MAX ST) 25 MCG (1000 UT) tablet Take by mouth.    . famotidine (PEPCID) 20 MG tablet One at bedtime 30 tablet 2  . VITAMIN K, PHYTONADIONE, PO Take by mouth.      No results found for this or any previous visit (from the past 48 hour(s)). No results found.  Review of Systems  Constitutional: Negative.   HENT: Negative.   Eyes: Negative.   Respiratory: Negative.   Cardiovascular: Negative.   Gastrointestinal: Negative.   Genitourinary: Negative.   Musculoskeletal: Negative.   Skin: Negative.   Neurological: Negative.   Psychiatric/Behavioral: Negative.     Blood pressure 139/80, pulse 68, temperature 98.1 F (36.7 C), temperature source Oral, resp. rate 16, height 5\' 11"  (1.803 m), weight 111.4 kg, SpO2 99 %. Physical Exam  Constitutional: He is oriented to person, place, and time. He appears well-developed and well-nourished.  HENT:  Head: Normocephalic and  atraumatic.  Eyes: Pupils are equal, round, and reactive to light. EOM are normal.  Cardiovascular: Normal rate.  Respiratory: Effort normal. No respiratory distress.  GI: Soft. He exhibits no distension.  Musculoskeletal:       Arms:  Neurological: He is alert and oriented to person, place, and time.  Skin: Skin is warm.  Psychiatric: He has a normal mood and affect. His behavior is normal. Judgment and thought content normal.     Assessment/Plan Plan for excision of right posterior neck mass.  [place], DO 07/18/2018, 9:47 AM

## 2018-07-18 NOTE — Discharge Instructions (Signed)
INSTRUCTIONS FOR AFTER SURGERY   You are having surgery.  You will likely have some questions about what to expect following your operation.  The following information will help you and your family understand what to expect when you are discharged from the hospital.  Following these guidelines will help ensure a smooth recovery and reduce risks of complications.   Postoperative instructions include information on: diet, wound care, medications and physical activity.  AFTER SURGERY Expect to go home after the procedure.  In some cases, you may need to spend one night in the hospital for observation.  DIET This surgery does not require a specific diet.  However, I have to mention that the healthier you eat the better your body can start healing. It is important to increasing your protein intake.  This means limiting the foods with sugar and carbohydrates.  Focus on vegetables and some meat.  If you have any liposuction during your procedure be sure to drink water.  If your urine is bright yellow, then it is concentrated, and you need to drink more water.  As a general rule after surgery, you should have 8 ounces of water every hour while awake.  If you find you are persistently nauseated or unable to take in liquids let us know.  NO TOBACCO USE or EXPOSURE.  This will slow your healing process and increase the risk of a wound.  WOUND CARE You can shower the day after surgery.  Use fragrance free soap.  Dial, Anton [place]iz and [place] are usually mild on the skin. If you have a drain clean with baby wipes until the drain is removed.  If you have steri-strips / tape directly attached to your skin leave them in place. It is OK to get these wet.  No baths, pools or hot tubs for two weeks. We close your incision to leave the smallest and best-looking scar. No ointment or creams on your incisions until given the go ahead.  Especially not Neosporin (Too many skin reactions with this one).  A few weeks after surgery you  can use Mederma and start massaging the scar. We ask you to wear your binder or sports bra for the first 6 weeks around the clock, including while sleeping. This provides added comfort and helps reduce the fluid accumulation at the surgery site.  ACTIVITY No heavy lifting until cleared by the doctor.  It is OK to walk and climb stairs. In fact, moving your legs is very important to decrease your risk of a blood clot.  It will also help keep you from getting deconditioned.  Every 1 to 2 hours get up and walk for 5 minutes. This will help with a quicker recovery back to normal.  Let pain be your guide so you don't do too much.  NO, you cannot do the spring cleaning and don't plan on taking care of anyone else.  This is your time for TLC.  You will be more comfortable if you sleep and rest with your head elevated either with a few pillows under you or in a recliner.  No stomach sleeping for a few months.  WORK Everyone returns to work at different times. As a rough guide, most people take at least 1 - 2 weeks off prior to returning to work. If you need documentation for your job, bring the forms to your postoperative follow up visit.  DRIVING Arrange for someone to bring you home from the hospital.  You may be able to drive a  few days after surgery but not while taking any narcotics or valium.  BOWEL MOVEMENTS Constipation can occur after anesthesia and while taking pain medication.  It is important to stay ahead for your comfort.  We recommend taking Milk of Magnesia (2 tablespoons; twice a day) while taking the pain pills.  SEROMA This is fluid your body tried to put in the surgical site.  This is normal but if it creates tight skinny skin let us know.  It usually decreases in a few weeks.  WHEN TO CALL Call your surgeon's office if any of the following occur:  Fever 101 degrees F or greater  Excessive bleeding or fluid from the incision site.  Pain that increases over time without aid from  the medications  Redness, warmth, or pus draining from incision sites  Persistent nausea or inability to take in liquids  Severe misshapen area that underwent the operation.   Post Anesthesia Home Care Instructions  Activity: Get plenty of rest for the remainder of the day. A responsible individual must stay with you for 24 hours following the procedure.  For the next 24 hours, DO NOT: -Drive a car -Paediatric nurse -Drink alcoholic beverages -Take any medication unless instructed by your physician -Make any legal decisions or sign important papers.  Meals: Start with liquid foods such as gelatin or soup. Progress to regular foods as tolerated. Avoid greasy, spicy, heavy foods. If nausea and/or vomiting occur, drink only clear liquids until the nausea and/or vomiting subsides. Call your physician if vomiting continues.  Special Instructions/Symptoms: Your throat may feel dry or sore from the anesthesia or the breathing tube placed in your throat during surgery. If this causes discomfort, gargle with warm salt water. The discomfort should disappear within 24 hours.  If you had a scopolamine patch placed behind your ear for the management of post- operative nausea and/or vomiting:  1. The medication in the patch is effective for 72 hours, after which it should be removed.  Wrap patch in a tissue and discard in the trash. Wash hands thoroughly with soap and water. 2. You may remove the patch earlier than 72 hours if you experience unpleasant side effects which may include dry mouth, dizziness or visual disturbances. 3. Avoid touching the patch. Wash your hands with soap and water after contact with the patch.

## 2018-07-18 NOTE — Transfer of Care (Signed)
Immediate Anesthesia Transfer of Care Note  Patient: Shane Mora  Procedure(s) Performed: EXCISION POSTERIOR NECK MASS (N/A Neck)  Patient Location: PACU  Anesthesia Type:General  Level of Consciousness: awake, sedated and confused  Airway & Oxygen Therapy: Patient Spontanous Breathing and Patient connected to face mask oxygen  Post-op Assessment: Report given to RN and Post -op Vital signs reviewed and stable  Post vital signs: Reviewed and stable  Last Vitals:  Vitals Value Taken Time  BP    Temp    Pulse    Resp    SpO2      Last Pain:  Vitals:   07/18/18 0911  TempSrc: Oral  PainSc: 0-No pain         Complications: No apparent anesthesia complications

## 2018-07-18 NOTE — Anesthesia Procedure Notes (Signed)
Procedure Name: Intubation Date/Time: 07/18/2018 10:18 AM Performed by: Raenette Rover, CRNA Pre-anesthesia Checklist: Patient identified, Emergency Drugs available, Suction available and Patient being monitored Patient Re-evaluated:Patient Re-evaluated prior to induction Oxygen Delivery Method: Circle system utilized Preoxygenation: Pre-oxygenation with 100% oxygen Induction Type: IV induction Ventilation: Mask ventilation without difficulty Laryngoscope Size: Mac and 4 Grade View: Grade I Tube type: Oral Tube size: 8.0 mm Number of attempts: 1 Airway Equipment and Method: Stylet Placement Confirmation: ETT inserted through vocal cords under direct vision,  positive ETCO2,  CO2 detector and breath sounds checked- equal and bilateral Secured at: 23 cm Tube secured with: Tape Dental Injury: Teeth and Oropharynx as per pre-operative assessment

## 2018-07-18 NOTE — Op Note (Signed)
DATE OF OPERATION: 07/18/2018  LOCATION: [facility]  PREOPERATIVE DIAGNOSIS: right posterior neck mass  POSTOPERATIVE DIAGNOSIS: Same  PROCEDURE: excision of right posterior neck lipoma 6 x 7 cm  SURGEON: Mamoru Takeshita Sanger Koree Schopf, DO  ASSISTANT: Carmen Mayo, PA  EBL: 1 cc  CONDITION: Stable  COMPLICATIONS: None  INDICATION: The patient, Shane Mora, is a 43 y.o. male born on 01-13-1976, is here for treatment of a mass, likely lipoma, on the right posterior aspect of his neck.   PROCEDURE DETAILS:  The patient was seen prior to surgery and marked.  The IV antibiotics were given. The patient was taken to the operating room and given a general anesthetic. A standard time out was performed and all information was confirmed by those in the room. SCDs were placed.   He was placed in the prone position.  He was prepped and draped.  Local was injected for intraoperative hemostasis and post operative pain control.  The #15 blade was used to make a horizontal incision within the crease of the neck.  The scissors were used to dissect to the mass.  It was lipoma like in character.   The entire lipoma was excised 6 x 7 cm.  It was released from the surrounding tissue with the hemostat and bovie to prevent bleeding.  The deep layer of dermis was closed with the 4-0 Monocryl.  The skin was closed with the 5-0 Monocryl.  Derma bond was applied.  The patient was allowed to wake up and taken to recovery room in stable condition at the end of the case. The family was notified at the end of the case.   The advanced practice practitioner (APP) assisted throughout the case.  The APP was essential in retraction and counter traction when needed to make the case progress smoothly.  This retraction and assistance made it possible to see the tissue plans for the procedure.  The assistance was needed for blood control, tissue re-approximation and assisted with closure of the incision site.

## 2018-07-19 ENCOUNTER — Encounter (HOSPITAL_BASED_OUTPATIENT_CLINIC_OR_DEPARTMENT_OTHER): Payer: Self-pay | Admitting: Plastic Surgery

## 2018-07-25 ENCOUNTER — Telehealth: Payer: Self-pay

## 2018-07-25 NOTE — Telephone Encounter (Signed)
Call to pt - to inform him of his pathology report- no answer- left v/m to have him call back  Shane Mora, RNFA

## 2018-07-26 ENCOUNTER — Encounter: Payer: Self-pay | Admitting: Plastic Surgery

## 2018-07-26 ENCOUNTER — Ambulatory Visit (INDEPENDENT_AMBULATORY_CARE_PROVIDER_SITE_OTHER): Payer: Managed Care, Other (non HMO) | Admitting: Plastic Surgery

## 2018-07-26 VITALS — BP 125/82 | HR 71 | Ht 71.0 in | Wt 240.0 lb

## 2018-07-26 DIAGNOSIS — D171 Benign lipomatous neoplasm of skin and subcutaneous tissue of trunk: Secondary | ICD-10-CM

## 2018-07-26 NOTE — Progress Notes (Signed)
   Subjective:    Patient ID: Shane Mora, male    DOB: 1976-05-03, 43 y.o.   MRN: [id number]  The patient is a 43 yrs old [place] wm here for follow up on his mass removal from the back of his neck.  The path showed a lipoma.  No signs of seroma.  Incision healing well.  He is very pleased with the results at this point in time.   Review of Systems  Constitutional: Negative.   HENT: Negative.   Eyes: Negative.   Respiratory: Negative.   Cardiovascular: Negative.   Endocrine: Negative.   Genitourinary: Negative.   Musculoskeletal: Negative.   Skin: Negative.        Objective:   Physical Exam Vitals signs and nursing note reviewed.  Constitutional:      Appearance: Normal appearance.  HENT:     Head: Normocephalic and atraumatic.  Cardiovascular:     Rate and Rhythm: Normal rate.  Pulmonary:     Effort: Pulmonary effort is normal.  Neurological:     General: No focal deficit present.     Mental Status: He is alert.        Assessment & Plan:  Lipoma of back May shower and get the area wet.  Compression as able.  Follow up in 1 month.

## 2018-08-02 ENCOUNTER — Telehealth: Payer: Self-pay | Admitting: Podiatry

## 2018-08-02 NOTE — Telephone Encounter (Signed)
Pt left a message yesterday asking if the resubmitted orthotics got covered by his insurance. Can you please verify with pt

## 2019-02-28 IMAGING — CR DG SKULL COMPLETE 4+V
5 series · 5 of 5 positions shown · non-contrast
Comparison: None.

CLINICAL DATA: Known lipoma at the skull base

EXAM:
SKULL - COMPLETE 4 + VIEW

[[person_name] pa]
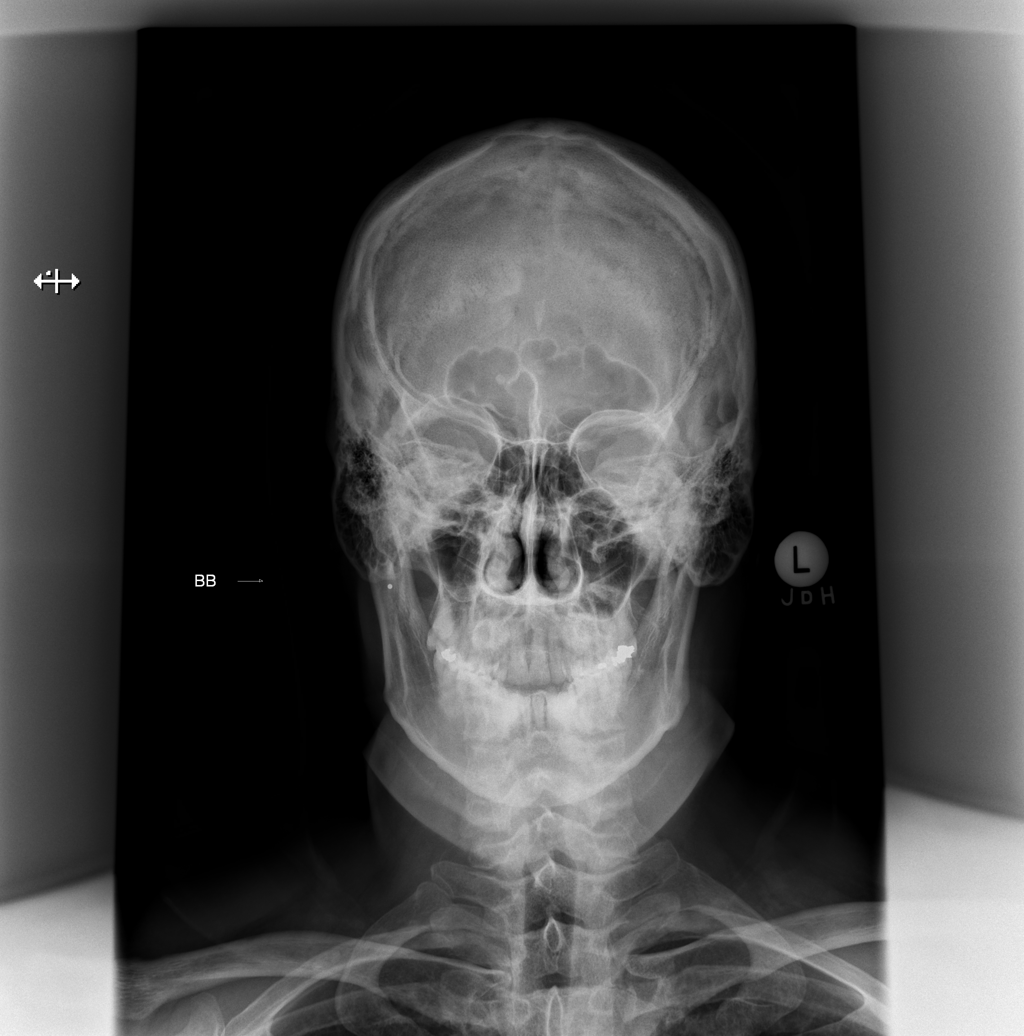

[w skull lat]
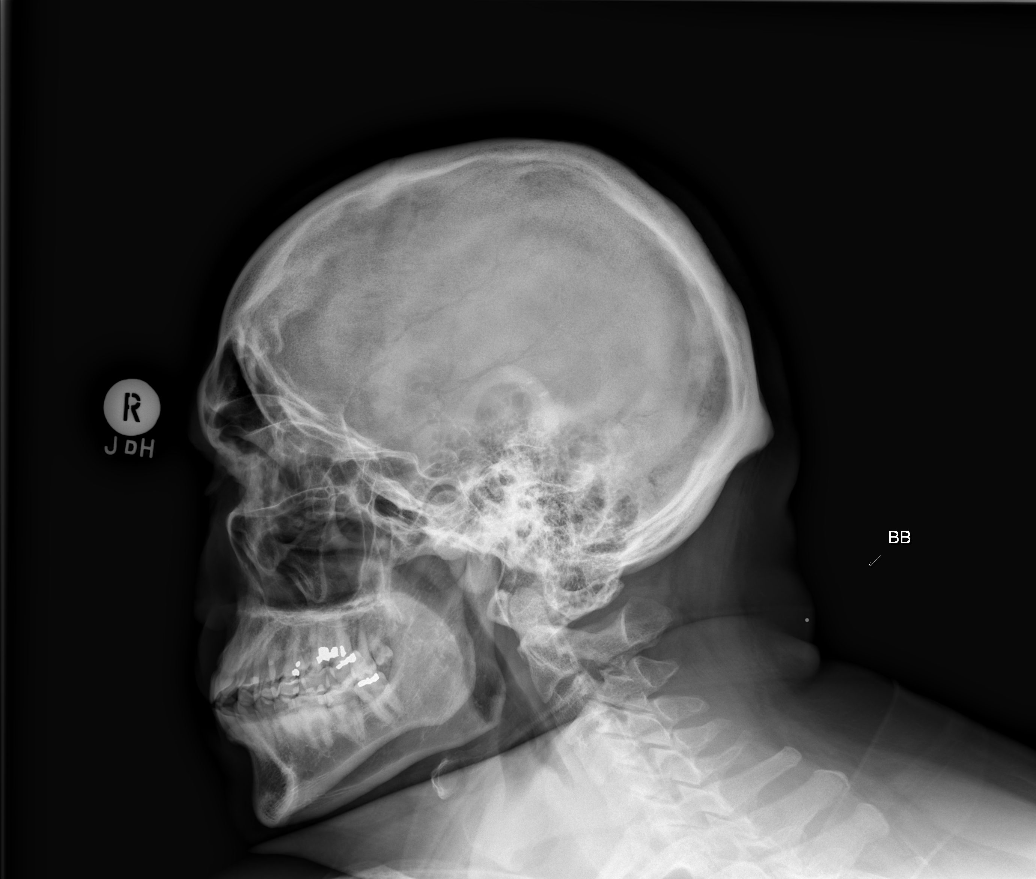

[[person_name]]
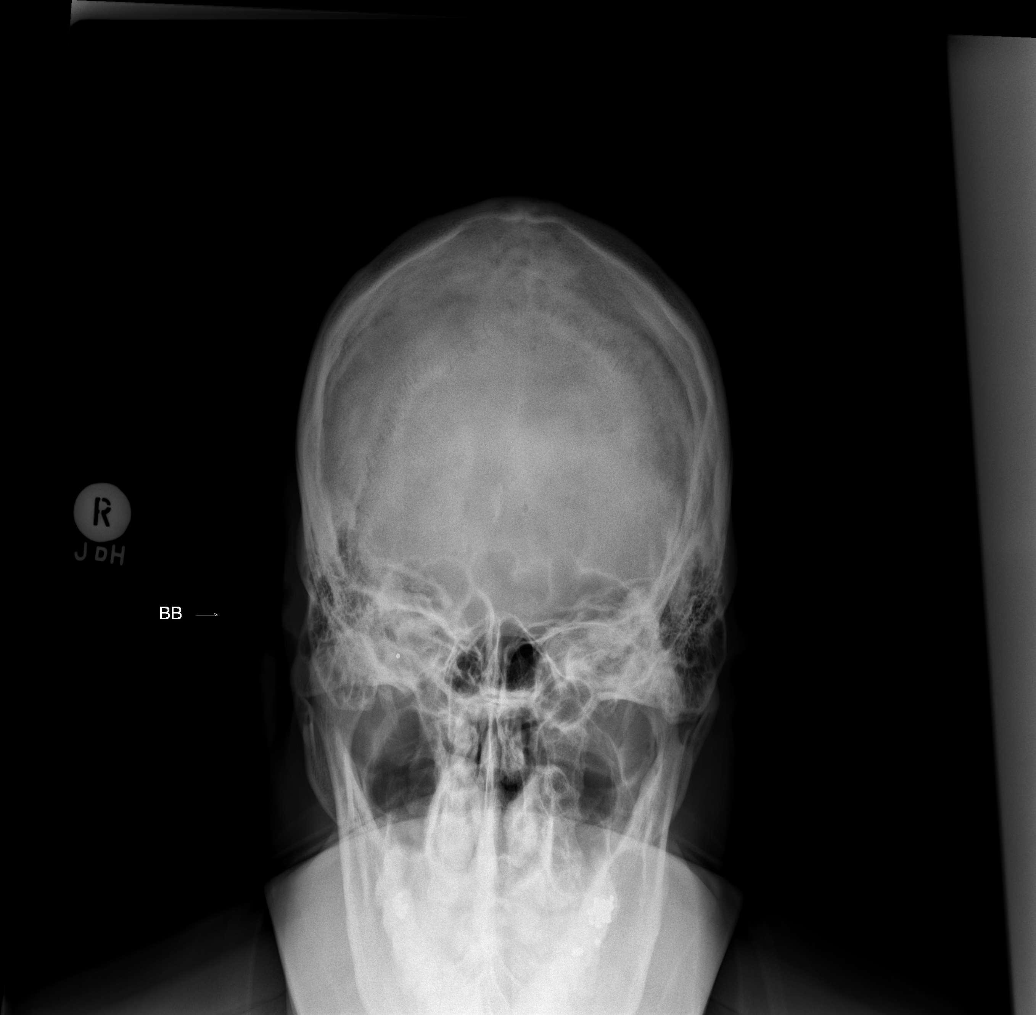

[w cervical spine lat]
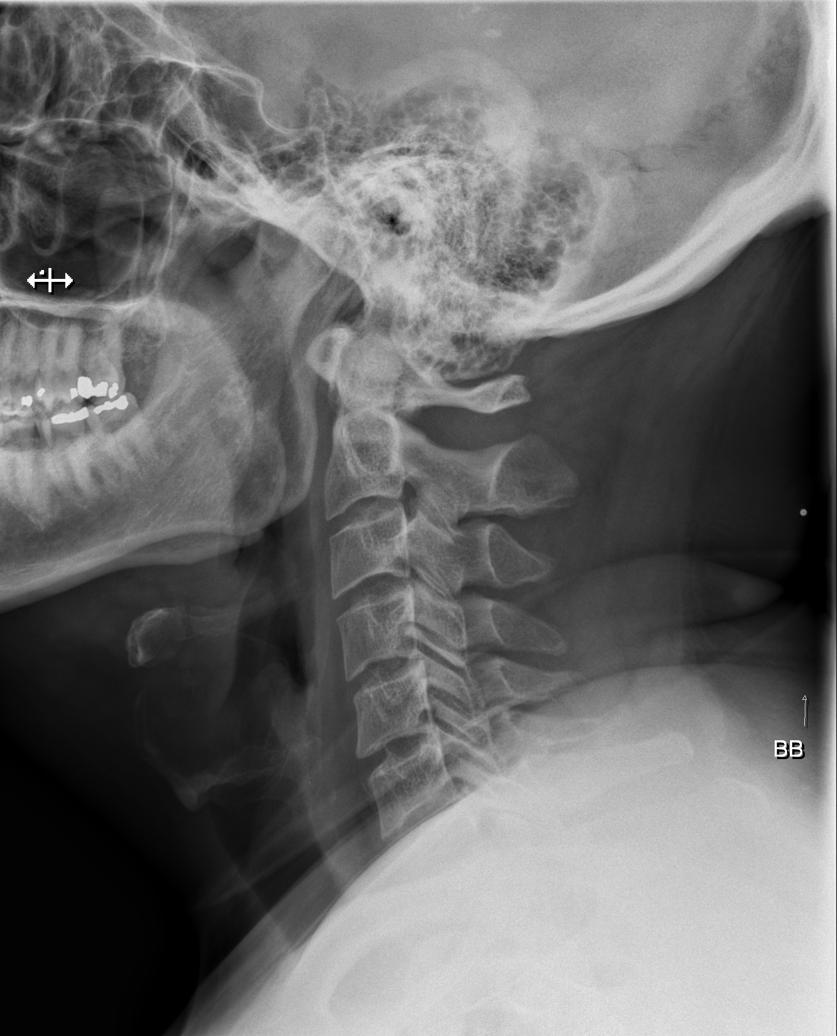

[w cervical spine ap]
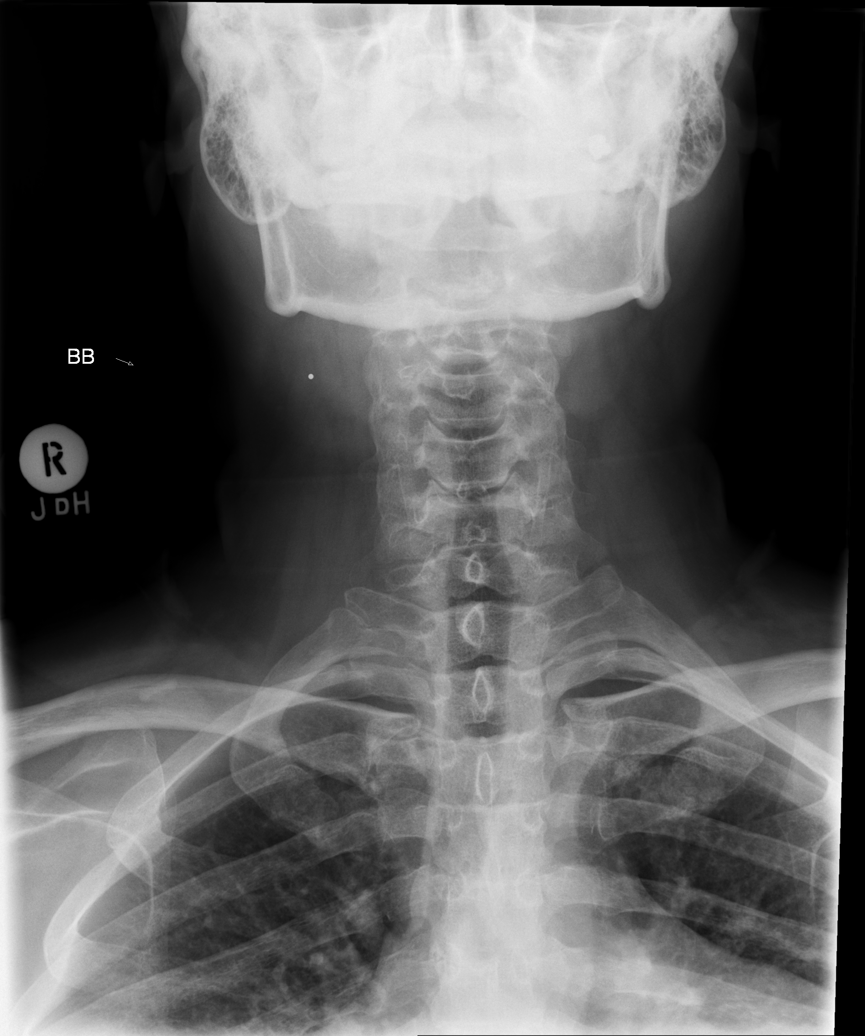

[5 of 5 positions shown; findings below may reference images not displayed]

FINDINGS: There is no evidence of skull fracture or other focal bone lesions.
Mild soft tissue prominence is noted in the area of clinical
concern. No underlying bony abnormality is seen.
IMPRESSION: No acute abnormality noted..

## 2020-05-25 DIAGNOSIS — H43393 Other vitreous opacities, bilateral: Secondary | ICD-10-CM | POA: Diagnosis not present

## 2020-05-25 DIAGNOSIS — H5213 Myopia, bilateral: Secondary | ICD-10-CM | POA: Diagnosis not present

## 2020-07-13 DIAGNOSIS — Z0000 Encounter for general adult medical examination without abnormal findings: Secondary | ICD-10-CM | POA: Diagnosis not present

## 2020-07-14 DIAGNOSIS — R7309 Other abnormal glucose: Secondary | ICD-10-CM | POA: Diagnosis not present

## 2020-07-14 DIAGNOSIS — Z13220 Encounter for screening for lipoid disorders: Secondary | ICD-10-CM | POA: Diagnosis not present

## 2020-07-14 DIAGNOSIS — Z125 Encounter for screening for malignant neoplasm of prostate: Secondary | ICD-10-CM | POA: Diagnosis not present

## 2020-09-17 DIAGNOSIS — B353 Tinea pedis: Secondary | ICD-10-CM | POA: Diagnosis not present

## 2020-09-17 DIAGNOSIS — L578 Other skin changes due to chronic exposure to nonionizing radiation: Secondary | ICD-10-CM | POA: Diagnosis not present

## 2020-09-17 DIAGNOSIS — B351 Tinea unguium: Secondary | ICD-10-CM | POA: Diagnosis not present

## 2020-09-17 DIAGNOSIS — D225 Melanocytic nevi of trunk: Secondary | ICD-10-CM | POA: Diagnosis not present

## 2021-03-15 DIAGNOSIS — K635 Polyp of colon: Secondary | ICD-10-CM | POA: Diagnosis not present

## 2021-03-15 DIAGNOSIS — Z1211 Encounter for screening for malignant neoplasm of colon: Secondary | ICD-10-CM | POA: Diagnosis not present

## 2021-03-15 DIAGNOSIS — K648 Other hemorrhoids: Secondary | ICD-10-CM | POA: Diagnosis not present

## 2021-11-23 DIAGNOSIS — D485 Neoplasm of uncertain behavior of skin: Secondary | ICD-10-CM | POA: Diagnosis not present

## 2021-11-23 DIAGNOSIS — L918 Other hypertrophic disorders of the skin: Secondary | ICD-10-CM | POA: Diagnosis not present

## 2021-11-23 DIAGNOSIS — D225 Melanocytic nevi of trunk: Secondary | ICD-10-CM | POA: Diagnosis not present

## 2021-11-23 DIAGNOSIS — L578 Other skin changes due to chronic exposure to nonionizing radiation: Secondary | ICD-10-CM | POA: Diagnosis not present

## 2021-11-23 DIAGNOSIS — D2271 Melanocytic nevi of right lower limb, including hip: Secondary | ICD-10-CM | POA: Diagnosis not present

## 2022-04-05 DIAGNOSIS — R7309 Other abnormal glucose: Secondary | ICD-10-CM | POA: Diagnosis not present

## 2022-04-05 DIAGNOSIS — H9190 Unspecified hearing loss, unspecified ear: Secondary | ICD-10-CM | POA: Diagnosis not present

## 2022-04-05 DIAGNOSIS — Z0000 Encounter for general adult medical examination without abnormal findings: Secondary | ICD-10-CM | POA: Diagnosis not present

## 2022-04-05 DIAGNOSIS — Z125 Encounter for screening for malignant neoplasm of prostate: Secondary | ICD-10-CM | POA: Diagnosis not present

## 2022-04-13 DIAGNOSIS — R7309 Other abnormal glucose: Secondary | ICD-10-CM | POA: Diagnosis not present

## 2022-04-21 DIAGNOSIS — H9041 Sensorineural hearing loss, unilateral, right ear, with unrestricted hearing on the contralateral side: Secondary | ICD-10-CM | POA: Diagnosis not present

## 2022-07-04 DIAGNOSIS — H1033 Unspecified acute conjunctivitis, bilateral: Secondary | ICD-10-CM | POA: Diagnosis not present

## 2022-09-15 DIAGNOSIS — H53143 Visual discomfort, bilateral: Secondary | ICD-10-CM | POA: Diagnosis not present

## 2022-10-11 DIAGNOSIS — D225 Melanocytic nevi of trunk: Secondary | ICD-10-CM | POA: Diagnosis not present

## 2022-10-11 DIAGNOSIS — D0362 Melanoma in situ of left upper limb, including shoulder: Secondary | ICD-10-CM | POA: Diagnosis not present

## 2022-10-11 DIAGNOSIS — D485 Neoplasm of uncertain behavior of skin: Secondary | ICD-10-CM | POA: Diagnosis not present

## 2022-10-11 DIAGNOSIS — B353 Tinea pedis: Secondary | ICD-10-CM | POA: Diagnosis not present

## 2022-10-11 DIAGNOSIS — L578 Other skin changes due to chronic exposure to nonionizing radiation: Secondary | ICD-10-CM | POA: Diagnosis not present

## 2022-11-01 DIAGNOSIS — D0362 Melanoma in situ of left upper limb, including shoulder: Secondary | ICD-10-CM | POA: Diagnosis not present

## 2022-11-01 DIAGNOSIS — D038 Melanoma in situ of other sites: Secondary | ICD-10-CM | POA: Diagnosis not present

## 2023-02-27 DIAGNOSIS — U071 COVID-19: Secondary | ICD-10-CM | POA: Diagnosis not present

## 2023-02-27 DIAGNOSIS — M25511 Pain in right shoulder: Secondary | ICD-10-CM | POA: Diagnosis not present

## 2023-04-06 DIAGNOSIS — R7309 Other abnormal glucose: Secondary | ICD-10-CM | POA: Diagnosis not present

## 2023-04-06 DIAGNOSIS — Z125 Encounter for screening for malignant neoplasm of prostate: Secondary | ICD-10-CM | POA: Diagnosis not present

## 2023-04-06 DIAGNOSIS — Z0000 Encounter for general adult medical examination without abnormal findings: Secondary | ICD-10-CM | POA: Diagnosis not present

## 2023-04-10 DIAGNOSIS — Z0000 Encounter for general adult medical examination without abnormal findings: Secondary | ICD-10-CM | POA: Diagnosis not present

## 2023-04-10 DIAGNOSIS — Z23 Encounter for immunization: Secondary | ICD-10-CM | POA: Diagnosis not present

## 2023-04-25 DIAGNOSIS — M25511 Pain in right shoulder: Secondary | ICD-10-CM | POA: Diagnosis not present

## 2023-05-02 DIAGNOSIS — M25511 Pain in right shoulder: Secondary | ICD-10-CM | POA: Diagnosis not present

## 2023-05-07 DIAGNOSIS — M25511 Pain in right shoulder: Secondary | ICD-10-CM | POA: Diagnosis not present

## 2023-05-09 DIAGNOSIS — M25511 Pain in right shoulder: Secondary | ICD-10-CM | POA: Diagnosis not present

## 2023-05-14 DIAGNOSIS — M25511 Pain in right shoulder: Secondary | ICD-10-CM | POA: Diagnosis not present

## 2023-05-16 DIAGNOSIS — M25511 Pain in right shoulder: Secondary | ICD-10-CM | POA: Diagnosis not present

## 2023-05-21 DIAGNOSIS — M25511 Pain in right shoulder: Secondary | ICD-10-CM | POA: Diagnosis not present

## 2023-05-23 DIAGNOSIS — D225 Melanocytic nevi of trunk: Secondary | ICD-10-CM | POA: Diagnosis not present

## 2023-05-23 DIAGNOSIS — D4989 Neoplasm of unspecified behavior of other specified sites: Secondary | ICD-10-CM | POA: Diagnosis not present

## 2023-05-23 DIAGNOSIS — M25511 Pain in right shoulder: Secondary | ICD-10-CM | POA: Diagnosis not present

## 2023-05-23 DIAGNOSIS — B351 Tinea unguium: Secondary | ICD-10-CM | POA: Diagnosis not present

## 2023-05-23 DIAGNOSIS — D485 Neoplasm of uncertain behavior of skin: Secondary | ICD-10-CM | POA: Diagnosis not present

## 2023-05-23 DIAGNOSIS — L578 Other skin changes due to chronic exposure to nonionizing radiation: Secondary | ICD-10-CM | POA: Diagnosis not present

## 2023-05-28 DIAGNOSIS — M25511 Pain in right shoulder: Secondary | ICD-10-CM | POA: Diagnosis not present

## 2023-05-31 DIAGNOSIS — M25511 Pain in right shoulder: Secondary | ICD-10-CM | POA: Diagnosis not present

## 2023-06-04 DIAGNOSIS — M25511 Pain in right shoulder: Secondary | ICD-10-CM | POA: Diagnosis not present

## 2023-06-11 DIAGNOSIS — Z3009 Encounter for other general counseling and advice on contraception: Secondary | ICD-10-CM | POA: Diagnosis not present

## 2023-06-13 DIAGNOSIS — M25511 Pain in right shoulder: Secondary | ICD-10-CM | POA: Diagnosis not present

## 2023-06-18 DIAGNOSIS — M25511 Pain in right shoulder: Secondary | ICD-10-CM | POA: Diagnosis not present

## 2023-06-25 DIAGNOSIS — M25521 Pain in right elbow: Secondary | ICD-10-CM | POA: Diagnosis not present

## 2023-06-25 DIAGNOSIS — M25511 Pain in right shoulder: Secondary | ICD-10-CM | POA: Diagnosis not present

## 2023-06-25 DIAGNOSIS — D0362 Melanoma in situ of left upper limb, including shoulder: Secondary | ICD-10-CM | POA: Diagnosis not present

## 2023-06-26 DIAGNOSIS — D0362 Melanoma in situ of left upper limb, including shoulder: Secondary | ICD-10-CM | POA: Diagnosis not present

## 2023-07-09 DIAGNOSIS — M25521 Pain in right elbow: Secondary | ICD-10-CM | POA: Diagnosis not present

## 2023-07-09 DIAGNOSIS — M25511 Pain in right shoulder: Secondary | ICD-10-CM | POA: Diagnosis not present

## 2023-07-23 DIAGNOSIS — M25521 Pain in right elbow: Secondary | ICD-10-CM | POA: Diagnosis not present

## 2023-07-23 DIAGNOSIS — M25511 Pain in right shoulder: Secondary | ICD-10-CM | POA: Diagnosis not present

## 2023-11-02 DIAGNOSIS — R062 Wheezing: Secondary | ICD-10-CM | POA: Diagnosis not present

## 2023-11-02 DIAGNOSIS — J988 Other specified respiratory disorders: Secondary | ICD-10-CM | POA: Diagnosis not present

## 2023-11-06 DIAGNOSIS — B353 Tinea pedis: Secondary | ICD-10-CM | POA: Diagnosis not present

## 2023-11-06 DIAGNOSIS — B351 Tinea unguium: Secondary | ICD-10-CM | POA: Diagnosis not present

## 2023-11-06 DIAGNOSIS — L578 Other skin changes due to chronic exposure to nonionizing radiation: Secondary | ICD-10-CM | POA: Diagnosis not present

## 2023-11-06 DIAGNOSIS — D171 Benign lipomatous neoplasm of skin and subcutaneous tissue of trunk: Secondary | ICD-10-CM | POA: Diagnosis not present

## 2024-04-23 DIAGNOSIS — B351 Tinea unguium: Secondary | ICD-10-CM | POA: Diagnosis not present

## 2024-04-23 DIAGNOSIS — L578 Other skin changes due to chronic exposure to nonionizing radiation: Secondary | ICD-10-CM | POA: Diagnosis not present

## 2024-04-23 DIAGNOSIS — B353 Tinea pedis: Secondary | ICD-10-CM | POA: Diagnosis not present

## 2024-04-23 DIAGNOSIS — D225 Melanocytic nevi of trunk: Secondary | ICD-10-CM | POA: Diagnosis not present

## 2024-04-23 DIAGNOSIS — L570 Actinic keratosis: Secondary | ICD-10-CM | POA: Diagnosis not present

## 2024-05-05 DIAGNOSIS — Z0000 Encounter for general adult medical examination without abnormal findings: Secondary | ICD-10-CM | POA: Diagnosis not present

## 2024-05-05 DIAGNOSIS — R7309 Other abnormal glucose: Secondary | ICD-10-CM | POA: Diagnosis not present

## 2024-05-05 DIAGNOSIS — Z125 Encounter for screening for malignant neoplasm of prostate: Secondary | ICD-10-CM | POA: Diagnosis not present

## 2024-05-06 DIAGNOSIS — R7309 Other abnormal glucose: Secondary | ICD-10-CM | POA: Diagnosis not present

## 2024-05-06 DIAGNOSIS — Z0000 Encounter for general adult medical examination without abnormal findings: Secondary | ICD-10-CM | POA: Diagnosis not present

## 2024-05-06 DIAGNOSIS — Z23 Encounter for immunization: Secondary | ICD-10-CM | POA: Diagnosis not present

## 2024-05-12 ENCOUNTER — Ambulatory Visit: Admitting: Podiatry

## 2024-05-12 ENCOUNTER — Ambulatory Visit (INDEPENDENT_AMBULATORY_CARE_PROVIDER_SITE_OTHER): Admitting: Podiatrist

## 2024-05-12 ENCOUNTER — Encounter: Payer: Self-pay | Admitting: Podiatry

## 2024-05-12 VITALS — Ht 71.0 in | Wt 240.0 lb

## 2024-05-12 DIAGNOSIS — Q741 Congenital malformation of knee: Secondary | ICD-10-CM

## 2024-05-12 DIAGNOSIS — M2141 Flat foot [pes planus] (acquired), right foot: Secondary | ICD-10-CM

## 2024-05-12 DIAGNOSIS — M2142 Flat foot [pes planus] (acquired), left foot: Secondary | ICD-10-CM | POA: Diagnosis not present

## 2024-05-12 NOTE — Progress Notes (Signed)
   Chief Complaint  Patient presents with   Foot Orthotics    Pt is here to discuss getting new orthotics.    HPI: 48 y.o. male presenting today as a reestablish new patient for new custom orthotics.  He has been wearing custom orthotics ever since he was a teenager.  Past Medical History:  Diagnosis Date   Anxiety     Past Surgical History:  Procedure Laterality Date   MASS EXCISION N/A 07/18/2018   Procedure: EXCISION POSTERIOR NECK MASS;  Surgeon: Lowery Estefana RAMAN, DO;  Location: [facility];  Service: Plastics;  Laterality: N/A;   NO PAST SURGERIES      Allergies  Allergen Reactions   Haemophilus Influenzae Shortness Of Breath   Influenza Virus Vaccine Shortness Of Breath     Physical Exam: General: The patient is alert and oriented x3 in no acute distress.  Dermatology: Skin is warm, dry and supple bilateral lower extremities.   Vascular: Palpable pedal pulses bilaterally. Capillary refill within normal limits.  No appreciable edema.  No erythema.  Neurological: Grossly intact via light touch  Musculoskeletal Exam: No pedal deformities noted.  Genu valgum noted with weightbearing  Assessment/Plan of Care: 1.  Genu valgum knock knee bilateral  -Patient evaluated -I do believe the patient would benefit from new custom molded orthotics to support the medial longitudinal arch and biomechanically support his lower extremities posteriorly -Today the patient was molded for new custom orthotics -His old orthotics were taken today and sent to the orthotics lab for refurbishing -Return to clinic orthotics pickup     Thresa EMERSON Sar, DPM [facility]  Dr. Thresa EMERSON Sar, DPM    [address] [place], [place] [address]                Office [phone]  Fax [phone]

## 2024-05-12 NOTE — Progress Notes (Signed)
 Visit:  ORTHOTIC SCAN/ EVALUATION  Patient presented for evaluation/ scan for custom molded foot orthotics.  Patient will benefit from custom foot orthotics to provide total contact to bilateral medial longitudinal arches to help balance and distribute body weight more evenly.  Thus reducing plantar pressure and pain.   Orthotic will encourage forefoot and rearfoot alignment.    Patient was scanned today with OHI scanner.    Orthotics are ordered.  Signature obtained for notification of pricing/ fees for the device.- charges entered   When the orthotic is ready for pick up, will call to make an appointment for a fitting.    New orthotics ordered.  I also took a pair of his old orthotics and am sending them off for refurbishment.    We will contact [place] when his 2 pairs of orthotics are back and ready for pick up.

## 2024-06-04 ENCOUNTER — Telehealth: Payer: Self-pay | Admitting: Podiatry

## 2024-06-04 NOTE — Telephone Encounter (Signed)
 Orthotics are in the [place] office. Left a message for Shane Mora to call back and schedule appointment to [facility].

## 2024-06-11 ENCOUNTER — Ambulatory Visit: Admitting: Podiatry

## 2024-06-11 ENCOUNTER — Encounter: Payer: Self-pay | Admitting: Podiatry

## 2024-06-11 VITALS — Ht 71.0 in | Wt 240.0 lb

## 2024-06-11 DIAGNOSIS — M2142 Flat foot [pes planus] (acquired), left foot: Secondary | ICD-10-CM

## 2024-06-11 DIAGNOSIS — M2141 Flat foot [pes planus] (acquired), right foot: Secondary | ICD-10-CM

## 2024-06-11 NOTE — Progress Notes (Signed)
" ° °  No chief complaint on file.   HPI: 49 y.o. male presenting today for orthotics pickup.  He has been wearing custom orthotics ever since he was a teenager.  Orthotics were dispensed today.  Break-in instructions provided.  Patient stated the orthotics seem to be comfortable and helps with alignment of his lower extremities.  Return to clinic PRN  Thresa EMERSON Sar, DPM [facility]  Dr. Thresa EMERSON Sar, DPM    [address] [place], [place] [address]                Office [phone]  Fax [phone]     "

## 2024-06-24 ENCOUNTER — Telehealth: Payer: Self-pay | Admitting: Podiatry

## 2024-06-24 DIAGNOSIS — M722 Plantar fascial fibromatosis: Secondary | ICD-10-CM

## 2024-06-24 NOTE — Telephone Encounter (Signed)
 Refurbished orthotics are in [place] office. Left a message for Jack to stop by and [facility]. No appt needed. Owes $90.

## 2024-06-27 NOTE — Telephone Encounter (Signed)
 Pt Pick up Orthotics in [place]
